# Patient Record
Sex: Female | Born: 1995 | Race: Black or African American | Hispanic: No | Marital: Single | Smoking: Never smoker
Health system: Southern US, Community
[De-identification: ages and names within clinical notes are randomized; demographics above are authoritative.]

---
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
## 2016-01-06 IMAGING — CR DG CHEST 2V
2 series · 2 of 2 positions shown · non-contrast
Comparison: None.

CLINICAL DATA: Acute onset of mid chest pain and cough. Dizziness.
Initial encounter.

EXAM:
CHEST  2 VIEW

[w chest pa]
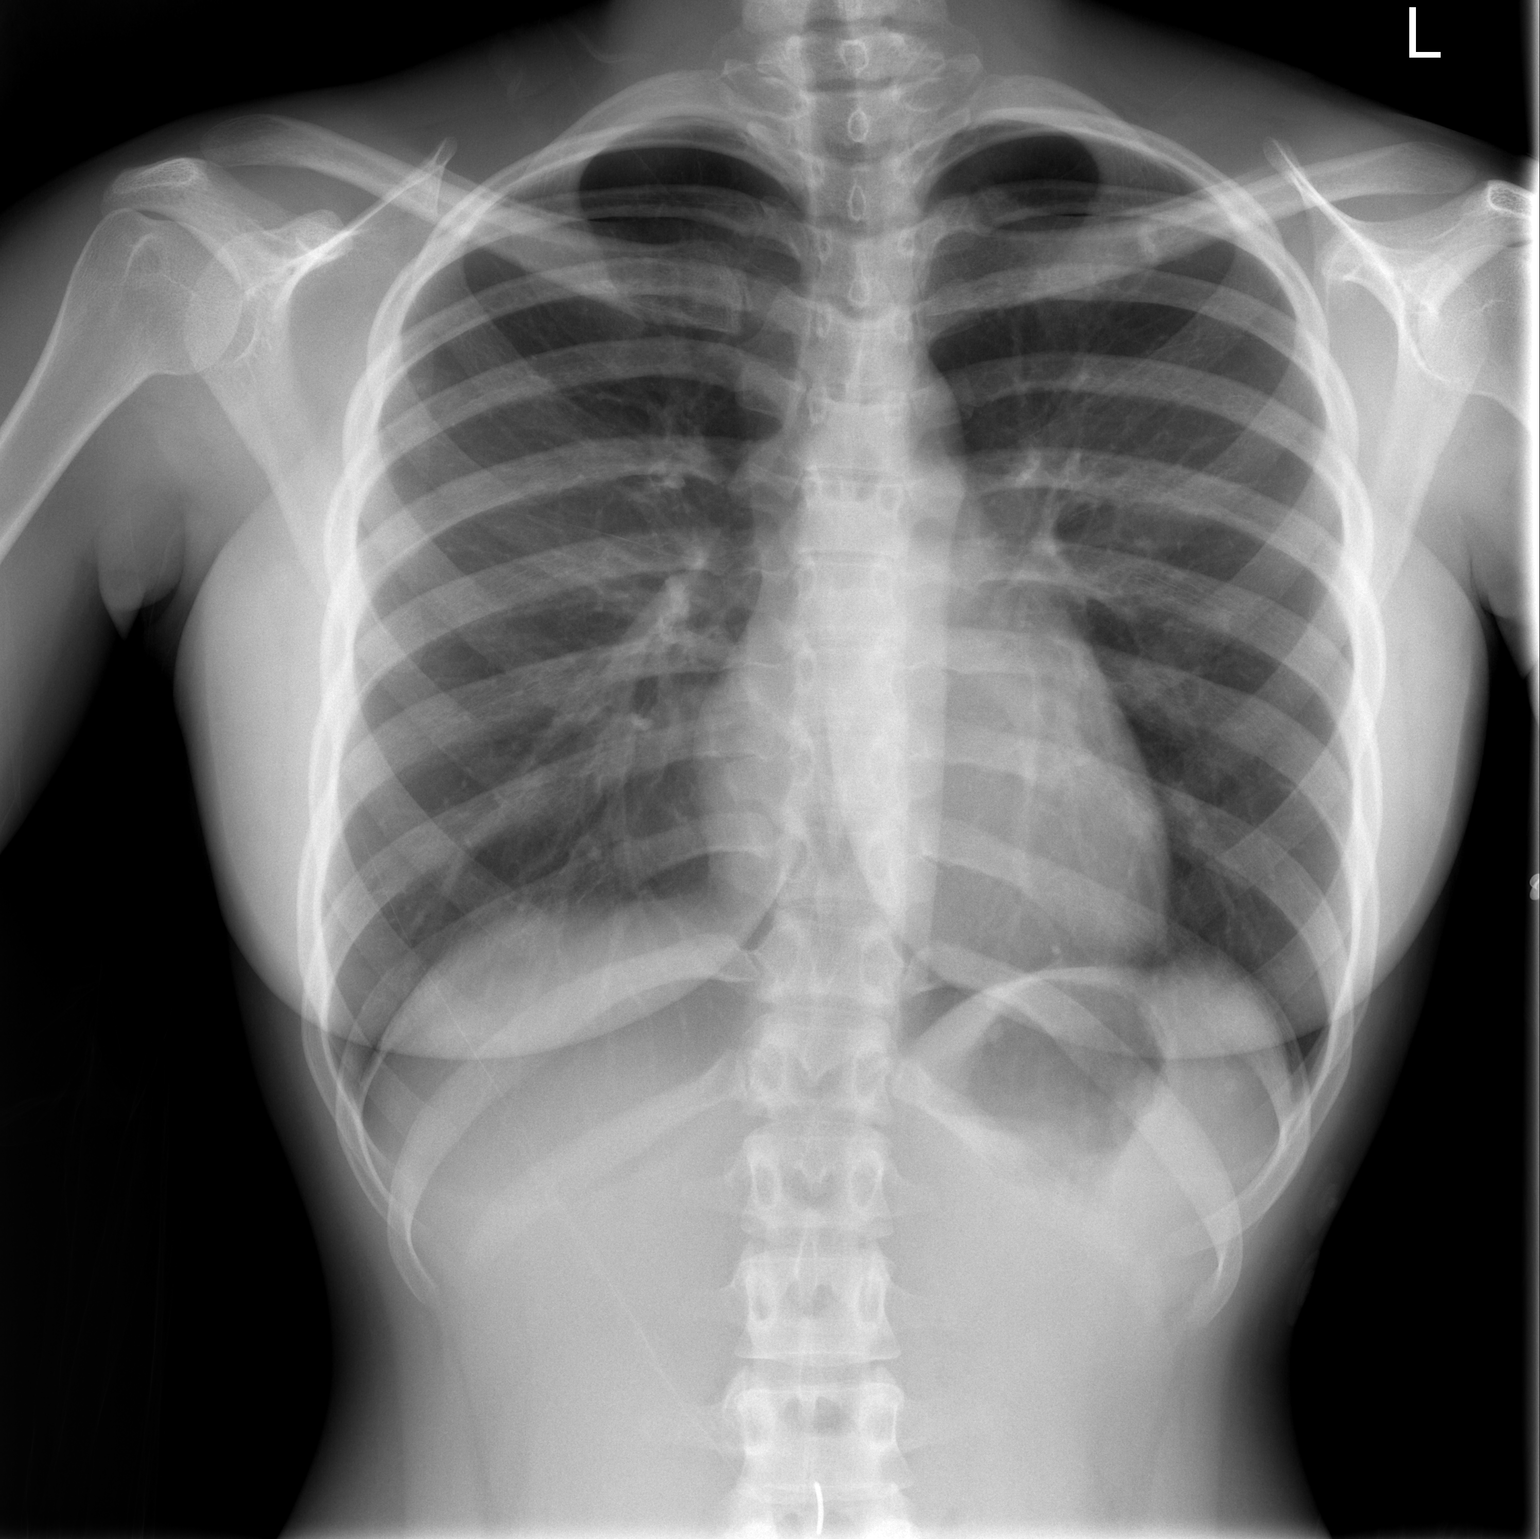

[w chest lat]
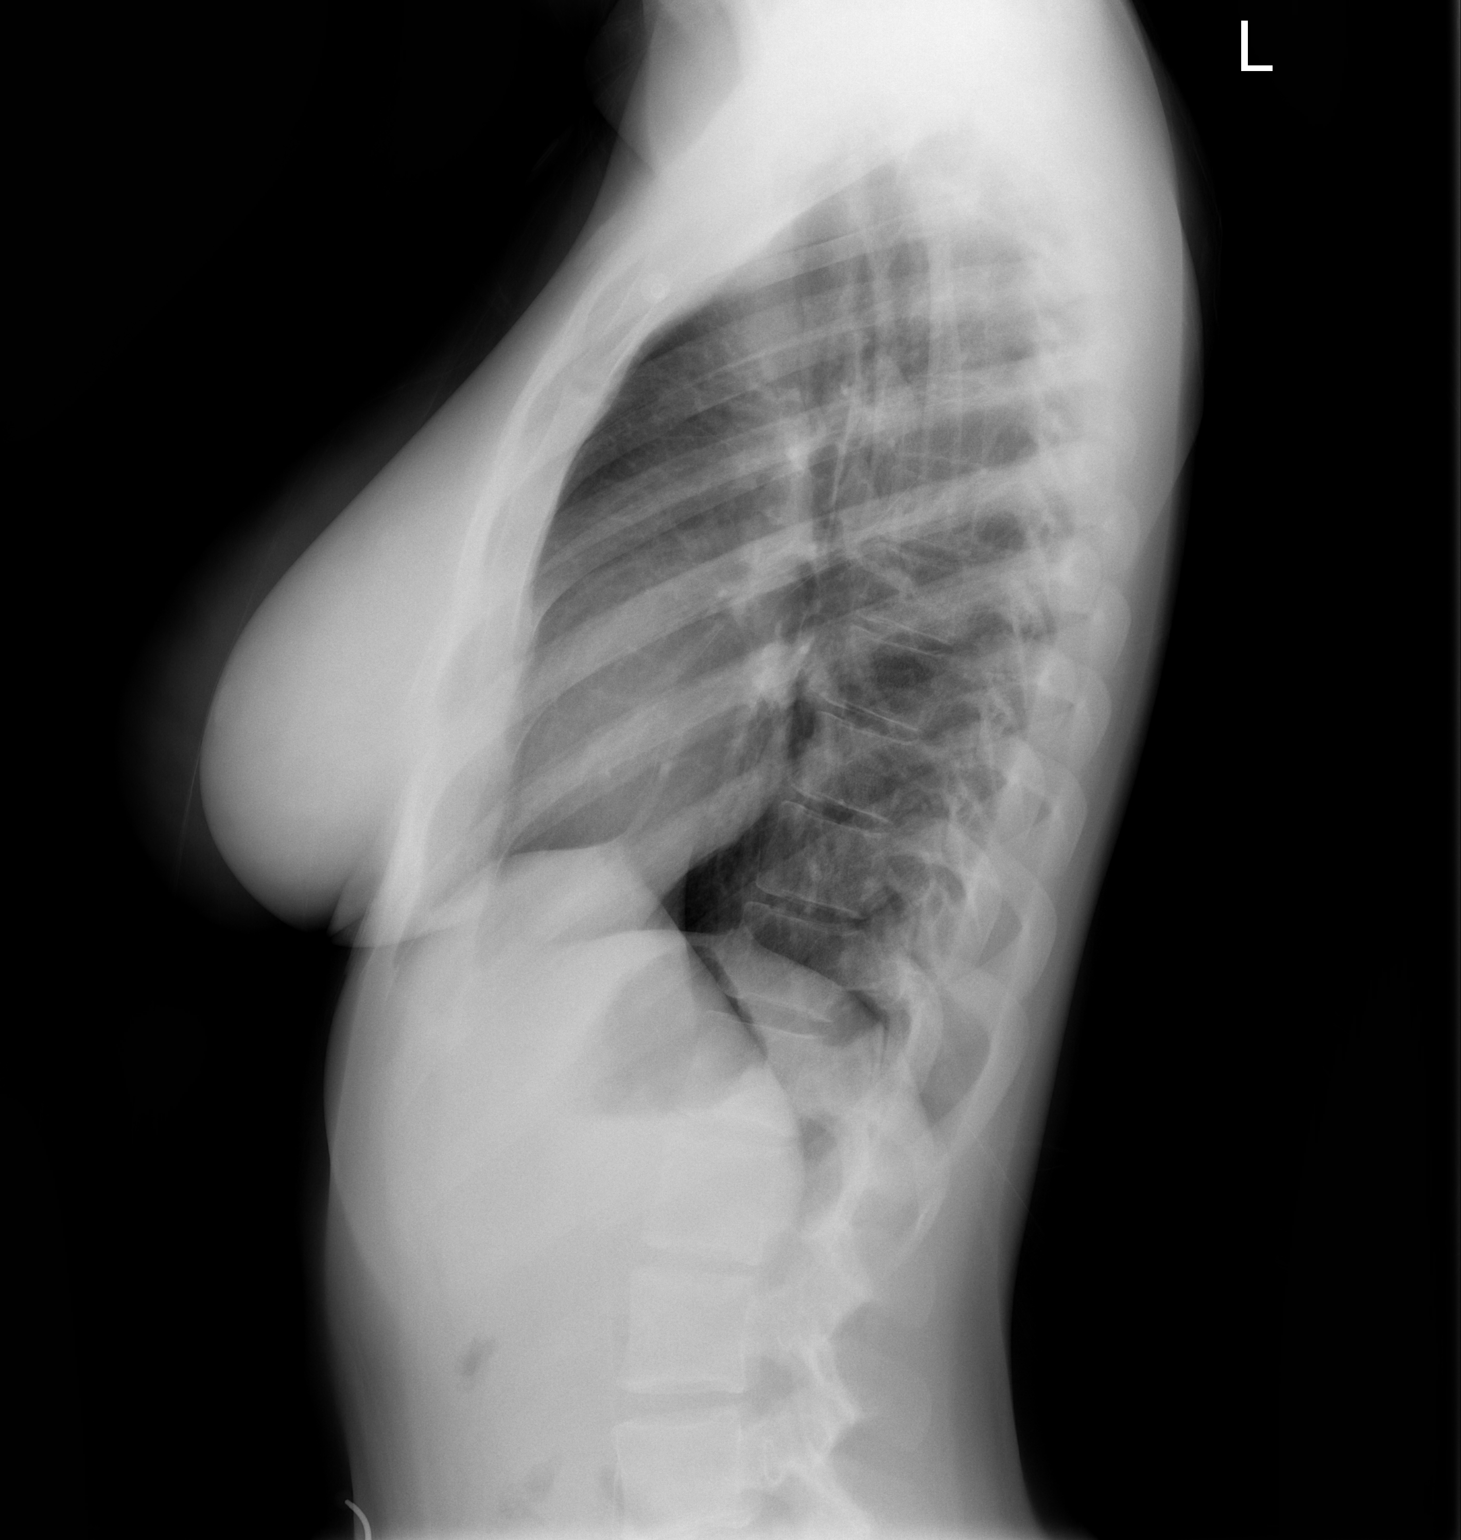

[2 of 2 positions shown; findings below may reference images not displayed]

FINDINGS: The lungs are well-aerated and clear. There is no evidence of focal
opacification, pleural effusion or pneumothorax.

The heart is normal in size; the mediastinal contour is within
normal limits. No acute osseous abnormalities are seen.
IMPRESSION: No acute cardiopulmonary process seen.

## 2016-12-23 ENCOUNTER — Emergency Department (HOSPITAL_BASED_OUTPATIENT_CLINIC_OR_DEPARTMENT_OTHER)
Admission: EM | Admit: 2016-12-23 | Discharge: 2016-12-23 | Disposition: A | Discharge status: Home / Self Care | Payer: Self-pay | Attending: Emergency Medicine | Admitting: Emergency Medicine

## 2016-12-23 ENCOUNTER — Encounter (HOSPITAL_BASED_OUTPATIENT_CLINIC_OR_DEPARTMENT_OTHER): Payer: Self-pay | Admitting: Emergency Medicine

## 2016-12-23 DIAGNOSIS — L03115 Cellulitis of right lower limb: Secondary | ICD-10-CM | POA: Insufficient documentation

## 2016-12-23 MED ORDER — SULFAMETHOXAZOLE-TRIMETHOPRIM 800-160 MG PO TABS: 1.0000 | ORAL_TABLET | Freq: Two times a day (BID) | ORAL | 0 refills | Status: AC

## 2016-12-23 MED ORDER — DIPHENHYDRAMINE HCL 25 MG PO CAPS
50.0000 mg | ORAL_CAPSULE | Freq: Once | ORAL | Status: AC
  Administered 2016-12-23: 50 mg via ORAL
  Filled 2016-12-23: qty 2

## 2016-12-23 MED ORDER — SULFAMETHOXAZOLE-TRIMETHOPRIM 800-160 MG PO TABS
1.0000 | ORAL_TABLET | Freq: Once | ORAL | Status: AC
  Administered 2016-12-23: 1 via ORAL
  Filled 2016-12-23: qty 1

## 2016-12-23 MED ORDER — DIPHENHYDRAMINE HCL 25 MG PO CAPS: 25.0000 mg | ORAL_CAPSULE | Freq: Three times a day (TID) | ORAL | 0 refills | Status: DC | PRN

## 2016-12-23 MED ORDER — CEPHALEXIN 500 MG PO CAPS: 500.0000 mg | ORAL_CAPSULE | Freq: Four times a day (QID) | ORAL | 0 refills | Status: DC

## 2016-12-23 MED ORDER — CEPHALEXIN 250 MG PO CAPS
500.0000 mg | ORAL_CAPSULE | Freq: Once | ORAL | Status: AC
  Administered 2016-12-23: 500 mg via ORAL
  Filled 2016-12-23: qty 2

## 2016-12-23 MED ORDER — PROBIOTIC PO CAPS: 1.0000 | ORAL_CAPSULE | Freq: Every day | ORAL | 1 refills | Status: DC

## 2016-12-23 NOTE — ED Triage Notes (Signed)
Pt has large round red area to posterior right thigh. Denies pain and itching

## 2016-12-23 NOTE — ED Provider Notes (Signed)
TIME SEEN: 3:36 AM  CHIEF COMPLAINT: Rash to the right leg  HPI: Patient is a 21 yo F with no significant past medical history who presents to the emergency department with a red, warm rash inside of her right thigh. She states it is itching. No fevers, chills, nausea, vomiting or diarrhea. No injury to this leg. Denies any recent insect bite or any new exposures. She states she noticed it yesterday. Does not feel like it is getting any bigger but is also not improving. She has not tried anything at home.  ROS: See HPI Constitutional: no fever  Eyes: no drainage  ENT: no runny nose   Cardiovascular:  no chest pain  Resp: no SOB  GI: no vomiting GU: no dysuria Integumentary: no rash  Allergy: no hives  Musculoskeletal: no leg swelling  Neurological: no slurred speech ROS otherwise negative  PAST MEDICAL HISTORY/PAST SURGICAL HISTORY:  History reviewed. No pertinent past medical history.  MEDICATIONS:  Prior to Admission medications   Medication Sig Start Date End Date Taking? Authorizing Provider  ibuprofen (ADVIL,MOTRIN) 600 MG tablet Take 1 tablet (600 mg total) by mouth every 6 (six) hours as needed for moderate pain. 09/04/15   Lyndal Pulley, MD    ALLERGIES:  No Known Allergies  SOCIAL HISTORY:  Social History  Substance Use Topics  . Smoking status: Never Smoker  . Smokeless tobacco: Never Used  . Alcohol use No    FAMILY HISTORY: No family history on file.  EXAM: BP (!) 154/108 (BP Location: Left Arm)   Pulse 83   Temp 98.3 F (36.8 C) (Oral)   Resp 16   Ht  (1.626 m)   Wt 68 kg (150 lb)   SpO2 100%   BMI 25.75 kg/m  CONSTITUTIONAL: Alert and oriented and responds appropriately to questions. Well-appearing; well-nourished HEAD: Normocephalic EYES: Conjunctivae clear, pupils appear equal, EOMI ENT: normal nose; moist mucous membranes NECK: Supple, no meningismus, no nuchal rigidity, no LAD  CARD: RRR; S1 and S2 appreciated; no murmurs, no clicks, no  rubs, no gallops RESP: Normal chest excursion without splinting or tachypnea; breath sounds clear and equal bilaterally; no wheezes, no rhonchi, no rales, no hypoxia or respiratory distress, speaking full sentences ABD/GI: Normal bowel sounds; non-distended; soft, non-tender, no rebound, no guarding, no peritoneal signs, no hepatosplenomegaly BACK:  The back appears normal and is non-tender to palpation, there is no CVA tenderness EXT: Normal ROM in all joints; non-tender to palpation; no edema; normal capillary refill; no cyanosis, no calf tenderness or swelling    SKIN: Normal color for age and race; warm; no rash, patient has approximately 10 x 6 cm area of redness, warmth with a small area of induration to the right inner and posterior thigh. There is no fluctuance on exam and no drainage. No urticaria. No care purpura. No blisters or desquamation. No bull's-eye rash. NEURO: Moves all extremities equally PSYCH: The patient's mood and manner are appropriate. Grooming and personal hygiene are appropriate.  MEDICAL DECISION MAKING: Patient here with what looks like cellulitis to the right inner thigh. It almost looks like she may have been by something as well. There is no drainage. There is no abscess for drainage. Given I'm concerned for possible infection I'm going to put her on Keflex and Bactrim for broad coverage but also recommended Benadryl as it is an antihistamine and could help if there is any allergic component and can help with her itching. This time I do not recommend steroids  given I'm concerned it could be infection. Have recommended Tylenol and Motrin for pain. We have outlined the area of redness with a tissue marker and have recommended if symptoms worsen and she develops systemic symptoms or this rash spread that she return to the emergency department or her PCP. Patient and family at bedside comfortable with this plan. No sign of any life-threatening illness, rash on exam.   At this  time, I do not feel there is any life-threatening condition present. I have reviewed and discussed all results (EKG, imaging, lab, urine as appropriate) and exam findings with patient/family. I have reviewed nursing notes and appropriate previous records.  I feel the patient is safe to be discharged home without further emergent workup and can continue workup as an outpatient as needed. Discussed usual and customary return precautions. Patient/family verbalize understanding and are comfortable with this plan.  Outpatient follow-up has been provided if needed. All questions have been answered.    Amir Fick, Layla Maw, DO 12/23/16 [phone]

## 2017-02-18 ENCOUNTER — Other Ambulatory Visit: Payer: Self-pay

## 2017-02-18 ENCOUNTER — Emergency Department (HOSPITAL_BASED_OUTPATIENT_CLINIC_OR_DEPARTMENT_OTHER)
Admission: EM | Admit: 2017-02-18 | Discharge: 2017-02-18 | Disposition: A | Discharge status: Home / Self Care | Payer: No Typology Code available for payment source | Attending: Emergency Medicine | Admitting: Emergency Medicine

## 2017-02-18 ENCOUNTER — Encounter (HOSPITAL_BASED_OUTPATIENT_CLINIC_OR_DEPARTMENT_OTHER): Payer: Self-pay | Admitting: Emergency Medicine

## 2017-02-18 DIAGNOSIS — Y998 Other external cause status: Secondary | ICD-10-CM | POA: Insufficient documentation

## 2017-02-18 DIAGNOSIS — S39012A Strain of muscle, fascia and tendon of lower back, initial encounter: Secondary | ICD-10-CM | POA: Diagnosis not present

## 2017-02-18 DIAGNOSIS — Y92410 Unspecified street and highway as the place of occurrence of the external cause: Secondary | ICD-10-CM | POA: Diagnosis not present

## 2017-02-18 DIAGNOSIS — Y9389 Activity, other specified: Secondary | ICD-10-CM | POA: Diagnosis not present

## 2017-02-18 DIAGNOSIS — S3992XA Unspecified injury of lower back, initial encounter: Secondary | ICD-10-CM | POA: Diagnosis present

## 2017-02-18 DIAGNOSIS — Z79899 Other long term (current) drug therapy: Secondary | ICD-10-CM | POA: Diagnosis not present

## 2017-02-18 MED ORDER — NAPROXEN 500 MG PO TABS: 500.0000 mg | ORAL_TABLET | Freq: Two times a day (BID) | ORAL | 0 refills | Status: DC
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
## 2018-06-06 ENCOUNTER — Other Ambulatory Visit: Payer: Self-pay

## 2018-06-06 ENCOUNTER — Encounter (HOSPITAL_BASED_OUTPATIENT_CLINIC_OR_DEPARTMENT_OTHER): Payer: Self-pay | Admitting: Emergency Medicine

## 2018-06-06 ENCOUNTER — Emergency Department (HOSPITAL_BASED_OUTPATIENT_CLINIC_OR_DEPARTMENT_OTHER)
Admission: EM | Admit: 2018-06-06 | Discharge: 2018-06-06 | Disposition: A | Discharge status: Home / Self Care | Payer: Self-pay | Attending: Emergency Medicine | Admitting: Emergency Medicine

## 2018-06-06 DIAGNOSIS — R110 Nausea: Secondary | ICD-10-CM | POA: Insufficient documentation

## 2018-06-06 LAB — URINALYSIS, ROUTINE W REFLEX MICROSCOPIC
Bilirubin Urine: NEGATIVE
Glucose, UA: NEGATIVE mg/dL
Hgb urine dipstick: NEGATIVE
Ketones, ur: NEGATIVE mg/dL
Nitrite: NEGATIVE
PROTEIN: NEGATIVE mg/dL
SPECIFIC GRAVITY, URINE: 1.025 (ref 1.005–1.030)
pH: 6 (ref 5.0–8.0)

## 2018-06-06 LAB — URINALYSIS, MICROSCOPIC (REFLEX)

## 2018-06-06 LAB — PREGNANCY, URINE: Preg Test, Ur: NEGATIVE

## 2018-06-06 MED ORDER — ONDANSETRON 4 MG PO TBDP: 4.0000 mg | ORAL_TABLET | Freq: Three times a day (TID) | ORAL | 0 refills | Status: DC | PRN

## 2018-06-06 NOTE — ED Provider Notes (Signed)
[facility] EMERGENCY DEPARTMENT Provider Note   CSN: [id number] Arrival date & time: 06/06/18  0827    History   Chief Complaint Chief Complaint  Patient presents with  . Nausea    HPI ZAKEYA BOUCH is a 23 y.o. female.     The history is provided by the patient. No language interpreter was used.   HAYDAN STYS is a 23 y.o. female who presents to the Emergency Department complaining of nausea. Two days ago at work she was exposed to a coworker that tested positive for the flu. Yesterday she began to feel slightly queasy and hot at times. She reports mild cough. No fevers, sore throat, nasal congestion, abdominal pain, vomiting. Currently her nausea has resolved. She denies any dysuria, vaginal discharge. She is not currently sexually active, LMP two weeks ago. She has no medical problems and takes no medications. She is requesting flu testing. No past medical history on file.  There are no active problems to display for this patient.   No past surgical history on file.   OB History   No obstetric history on file.      Home Medications    Prior to Admission medications   Medication Sig Start Date End Date Taking? Authorizing Provider  ondansetron (ZOFRAN ODT) 4 MG disintegrating tablet Take 1 tablet (4 mg total) by mouth every 8 (eight) hours as needed for nausea or vomiting. 06/06/18   Tilden Fossa, MD    Family History No family history on file.  Social History Social History   Tobacco Use  . Smoking status: Never Smoker  . Smokeless tobacco: Never Used  Substance Use Topics  . Alcohol use: No  . Drug use: No     Allergies   Patient has no known allergies.   Review of Systems Review of Systems  All other systems reviewed and are negative.    Physical Exam Updated Vital Signs BP 133/85   Pulse 89   Temp 98.2 F (36.8 C) (Oral)   Resp 16   Ht 5\' 4"  (1.626 m)   Wt 68 kg   LMP 05/23/2018   SpO2 100%   BMI 25.75 kg/m     Physical Exam Vitals signs and nursing note reviewed.  Constitutional:      Appearance: She is well-developed.  HENT:     Head: Normocephalic and atraumatic.     Mouth/Throat:     Mouth: Mucous membranes are moist.     Pharynx: No posterior oropharyngeal erythema.  Cardiovascular:     Rate and Rhythm: Normal rate and regular rhythm.     Heart sounds: No murmur.  Pulmonary:     Effort: Pulmonary effort is normal. No respiratory distress.     Breath sounds: Normal breath sounds.  Abdominal:     Palpations: Abdomen is soft.     Tenderness: There is no abdominal tenderness. There is no guarding or rebound.  Musculoskeletal:        General: No swelling or tenderness.  Skin:    General: Skin is warm and dry.     Capillary Refill: Capillary refill takes less than 2 seconds.  Neurological:     Mental Status: She is alert and oriented to person, place, and time.  Psychiatric:        Behavior: Behavior normal.      ED Treatments / Results  Labs (all labs ordered are listed, but only abnormal results are displayed) Labs Reviewed  URINALYSIS, ROUTINE W REFLEX MICROSCOPIC -  Abnormal; Notable for the following components:      Result Value   APPearance CLOUDY (*)    Leukocytes,Ua MODERATE (*)    All other components within normal limits  URINALYSIS, MICROSCOPIC (REFLEX) - Abnormal; Notable for the following components:   Bacteria, UA MANY (*)    All other components within normal limits  PREGNANCY, URINE    EKG None  Radiology No results found.  Procedures Procedures (including critical care time)  Medications Ordered in ED Medications - No data to display   Initial Impression / Assessment and Plan / ED Course  I have reviewed the triage vital signs and the nursing notes.  Pertinent labs & imaging results that were available during my care of the patient were reviewed by me and considered in my medical decision making (see chart for details).        Presents  to the emergency department complaining of nausea as well as flu exposure. She is non-toxic appearing on evaluation with no abdominal tenderness. With numerous squamous cells present, patient without dysuria, vaginal discharge. Would not treat for UTI in setting of current symptoms and urinalysis. Discussed with patient home care for nausea as well as flu exposure. Discussed outpatient follow-up and return precautions. Presentation is not consistent with acute appendicitis, sepsis.  Final Clinical Impressions(s) / ED Diagnoses   Final diagnoses:  Nausea    ED Discharge Orders         Ordered    ondansetron (ZOFRAN ODT) 4 MG disintegrating tablet  Every 8 hours PRN     06/06/18 0957           Tilden Fossa, MD 06/06/18 [phone]

## 2018-06-06 NOTE — ED Triage Notes (Addendum)
Pt states she has a co-worker that has the flu.  Yesterday and today pt has slight nausea.  Pt states she felt "hot".  No chills, no body aches, no runny nose, slight cough.  No acute illness noted.  Pt requesting to be tested for flu

## 2018-06-06 NOTE — ED Notes (Signed)
C/o nausea , slight ha and hot feeling after co worker test pos to flu 2 days ago

## 2019-10-23 ENCOUNTER — Emergency Department (HOSPITAL_BASED_OUTPATIENT_CLINIC_OR_DEPARTMENT_OTHER): Payer: Self-pay

## 2019-10-23 ENCOUNTER — Other Ambulatory Visit: Payer: Self-pay

## 2019-10-23 ENCOUNTER — Encounter (HOSPITAL_BASED_OUTPATIENT_CLINIC_OR_DEPARTMENT_OTHER): Payer: Self-pay | Admitting: Emergency Medicine

## 2019-10-23 DIAGNOSIS — Y999 Unspecified external cause status: Secondary | ICD-10-CM | POA: Insufficient documentation

## 2019-10-23 DIAGNOSIS — S90122A Contusion of left lesser toe(s) without damage to nail, initial encounter: Secondary | ICD-10-CM | POA: Insufficient documentation

## 2019-10-23 DIAGNOSIS — Y939 Activity, unspecified: Secondary | ICD-10-CM | POA: Insufficient documentation

## 2019-10-23 DIAGNOSIS — W2209XA Striking against other stationary object, initial encounter: Secondary | ICD-10-CM | POA: Insufficient documentation

## 2019-10-23 DIAGNOSIS — Y929 Unspecified place or not applicable: Secondary | ICD-10-CM | POA: Insufficient documentation

## 2019-10-23 MED ORDER — ACETAMINOPHEN 325 MG PO TABS
650.0000 mg | ORAL_TABLET | Freq: Once | ORAL | Status: AC
  Administered 2019-10-23: 650 mg via ORAL
  Filled 2019-10-23: qty 2

## 2019-10-23 NOTE — ED Triage Notes (Signed)
  Patient comes in with toe pain that started earlier today.  Patient states around 1430 she was playing with her niece and accidentally kicked a doorframe.  Left 4th toe is discolored and painful to touch.  Pain 7/10.

## 2019-10-24 ENCOUNTER — Emergency Department (HOSPITAL_BASED_OUTPATIENT_CLINIC_OR_DEPARTMENT_OTHER)
Admission: EM | Admit: 2019-10-24 | Discharge: 2019-10-24 | Disposition: A | Discharge status: Home / Self Care | Payer: Self-pay | Attending: Emergency Medicine | Admitting: Emergency Medicine

## 2019-10-24 DIAGNOSIS — S90122A Contusion of left lesser toe(s) without damage to nail, initial encounter: Secondary | ICD-10-CM

## 2019-10-24 MED ORDER — NAPROXEN 250 MG PO TABS
500.0000 mg | ORAL_TABLET | Freq: Once | ORAL | Status: AC
  Administered 2019-10-24: 500 mg via ORAL
  Filled 2019-10-24: qty 2

## 2019-10-24 NOTE — ED Provider Notes (Signed)
[facility] Provider Note: Tiffany Dell, MD, FACEP  CSN: [id number] MRN: [id number] ARRIVAL: 10/23/19 at 2316 ROOM: [facility]   CHIEF COMPLAINT  Toe Injury   HISTORY OF PRESENT ILLNESS  10/24/19 2:58 AM Tiffany Berry is a 24 y.o. female who injured her left fourth toe when she kicked a door frame accidentally.  This occurred about 2:30 PM yesterday afternoon.  Her left fourth toe is discolored and tender.  She rates the pain is a 7 out of 10, described as soreness, worse with weightbearing.    History reviewed. No pertinent past medical history.  History reviewed. No pertinent surgical history.  History reviewed. No pertinent family history.  Social History   Tobacco Use   Smoking status: Never Smoker   Smokeless tobacco: Never Used  Substance Use Topics   Alcohol use: No   Drug use: No    Prior to Admission medications   Not on File    Allergies Patient has no known allergies.   REVIEW OF SYSTEMS  Negative except as noted here or in the History of Present Illness.   PHYSICAL EXAMINATION  Initial Vital Signs Blood pressure (!) 135/93, pulse 91, temperature 98.8 F (37.1 C), resp. rate 19, height 5\' 4"  (1.626 m), weight 68 kg, last menstrual period 09/28/2019, SpO2 100 %.  Examination General: Well-developed, well-nourished female in no acute distress; appearance consistent with age of record HENT: normocephalic; atraumatic Eyes: Normal appearance Neck: supple Heart: regular rate and rhythm Lungs: clear to auscultation bilaterally Abdomen: soft; nondistended; nontender; bowel sounds present Extremities: No deformity; ecchymosis of left fourth toe without significant tenderness, distal capillary refill brisk Neurologic: Awake, alert and oriented; motor function intact in all extremities and symmetric; no facial droop Skin: Warm and dry Psychiatric: Normal mood and affect   RESULTS  Summary of this visit's results, reviewed and  interpreted by myself:   EKG Interpretation  Date/Time:    Ventricular Rate:    PR Interval:    QRS Duration:   QT Interval:    QTC Calculation:   R Axis:     Text Interpretation:        Laboratory Studies: No results found for this or any previous visit (from the past 24 hour(s)). Imaging Studies: DG Foot Complete Left  Result Date: 10/24/2019 CLINICAL DATA:  24 year old female with pain after blunt trauma, kicked a door frame. Left 4th toe injury. EXAM: LEFT FOOT - COMPLETE 3+ VIEW COMPARISON:  None. FINDINGS: Bone mineralization is within normal limits. The left 4th phalanges appear intact and normally aligned. There is no evidence of fracture or dislocation. There is no evidence of arthropathy or other focal bone abnormality. No discrete soft tissue injury. IMPRESSION: Negative. Electronically Signed   By: 30 M.D.   On: 10/24/2019 00:09    ED COURSE and MDM  Nursing notes, initial and subsequent vitals signs, including pulse oximetry, reviewed and interpreted by myself.  Vitals:   10/23/19 2328 10/23/19 2333  BP: (!) 135/93   Pulse: 91   Resp: 19   Temp:  98.8 F (37.1 C)  TempSrc: Oral   SpO2: 100%   Weight:  68 kg  Height:  5\' 4"  (1.626 m)   Medications  naproxen (NAPROSYN) tablet 500 mg (has no administration in time range)  acetaminophen (TYLENOL) tablet 650 mg (650 mg Oral Given 10/23/19 2340)   Examination and radiographs consistent with contusion of toe.  PROCEDURES  Procedures   ED DIAGNOSES  ICD-10-CM   1. Contusion of fourth toe of left foot, initial encounter  R41.638G        Paula Libra, MD 10/24/19 [phone]

## 2021-06-16 IMAGING — DX DG FOOT COMPLETE 3+V*L*
3 series · 3 of 3 positions shown · non-contrast
Comparison: None.

CLINICAL DATA: 24-year-old female with pain after blunt trauma,
kicked a door frame. Left 4th toe injury.

EXAM:
LEFT FOOT - COMPLETE 3+ VIEW

[foot ap]
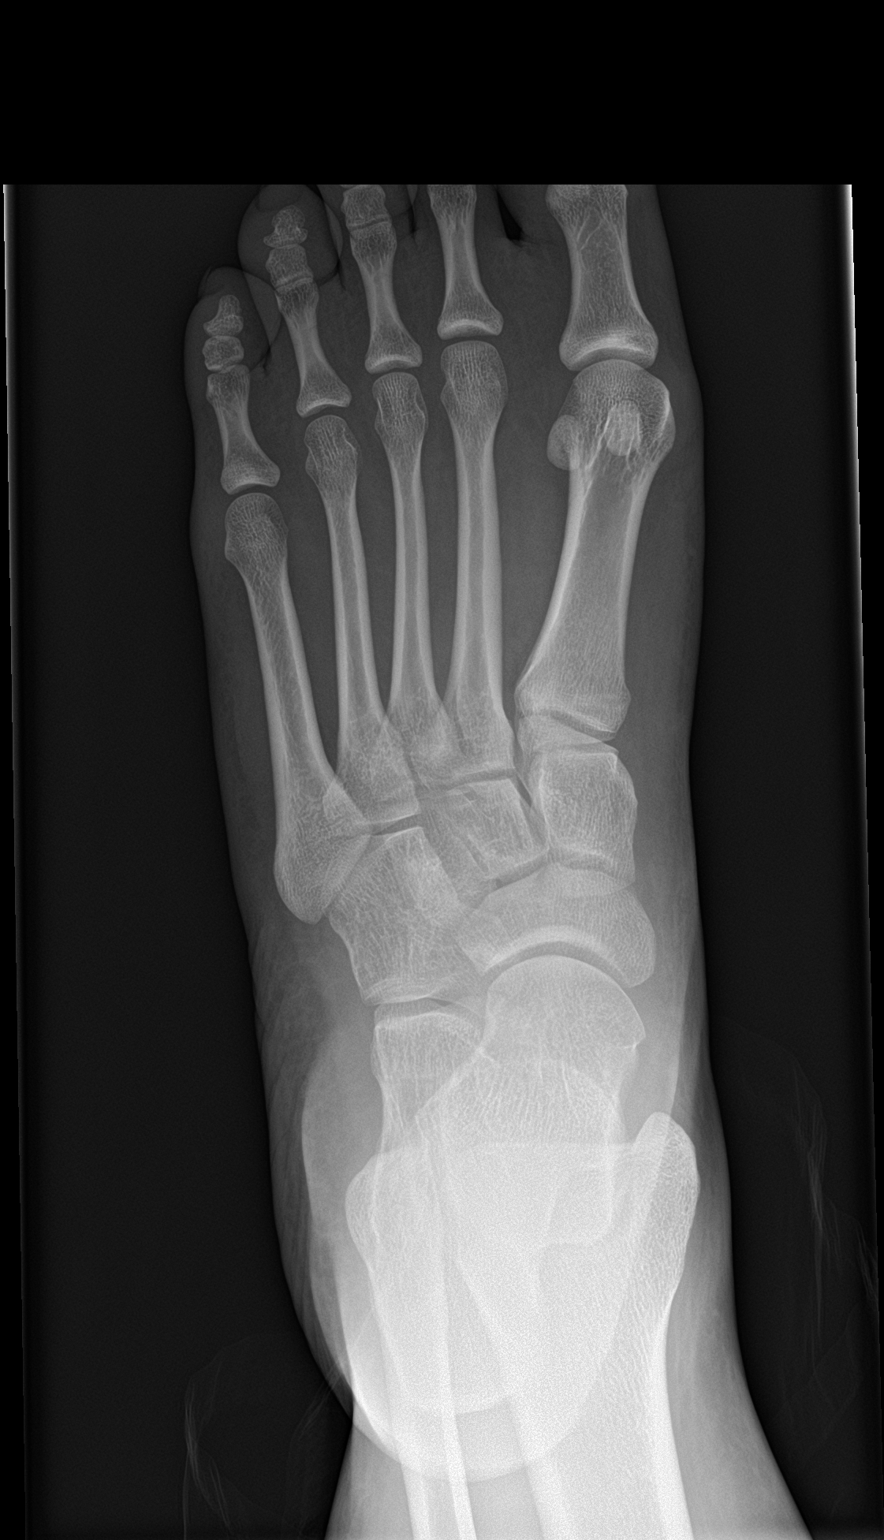

[foot obl]
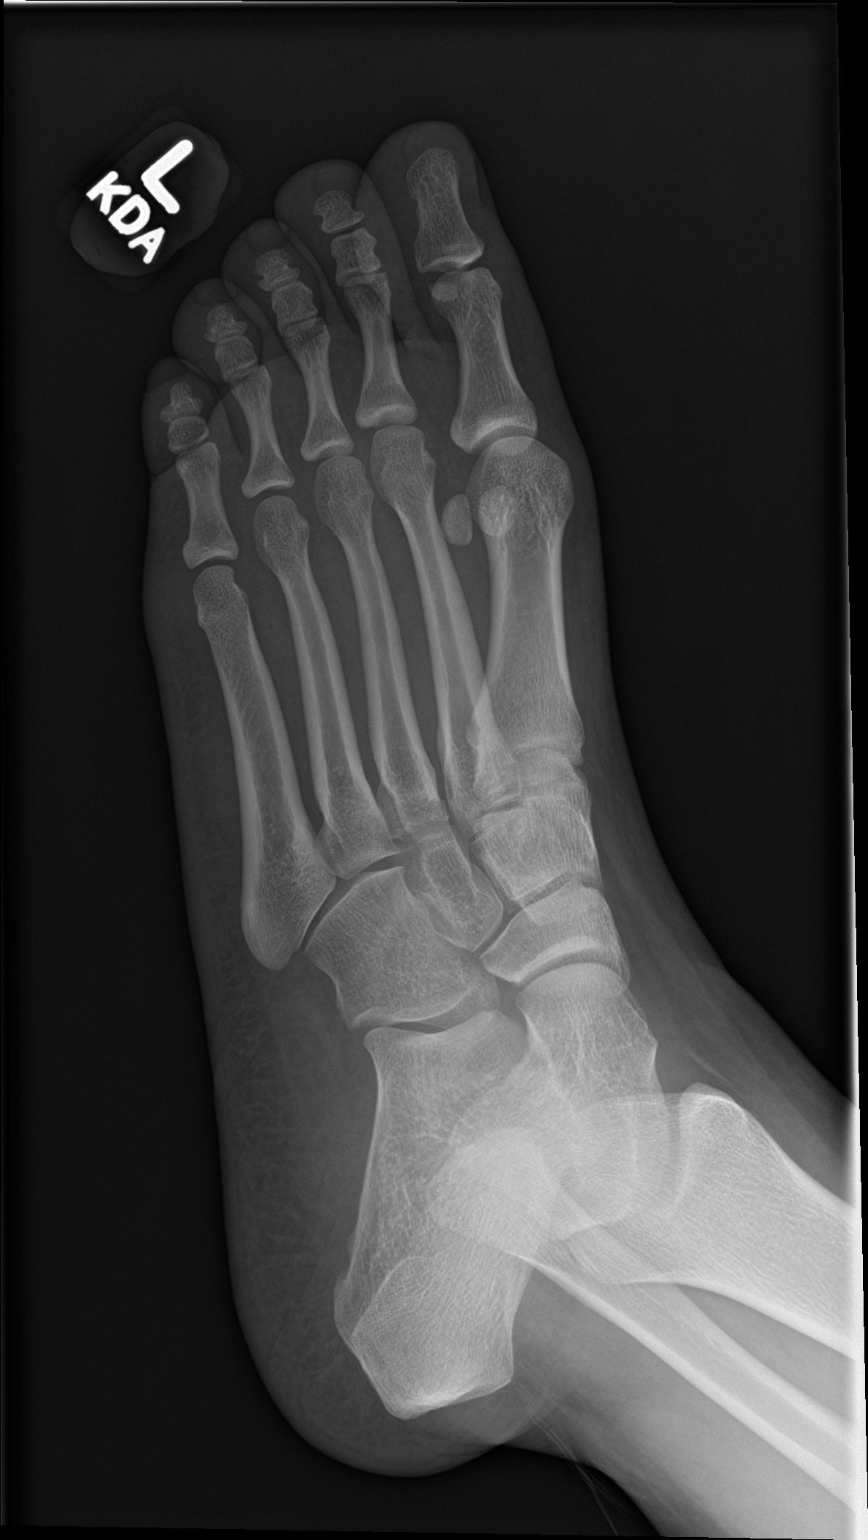

[foot lat]
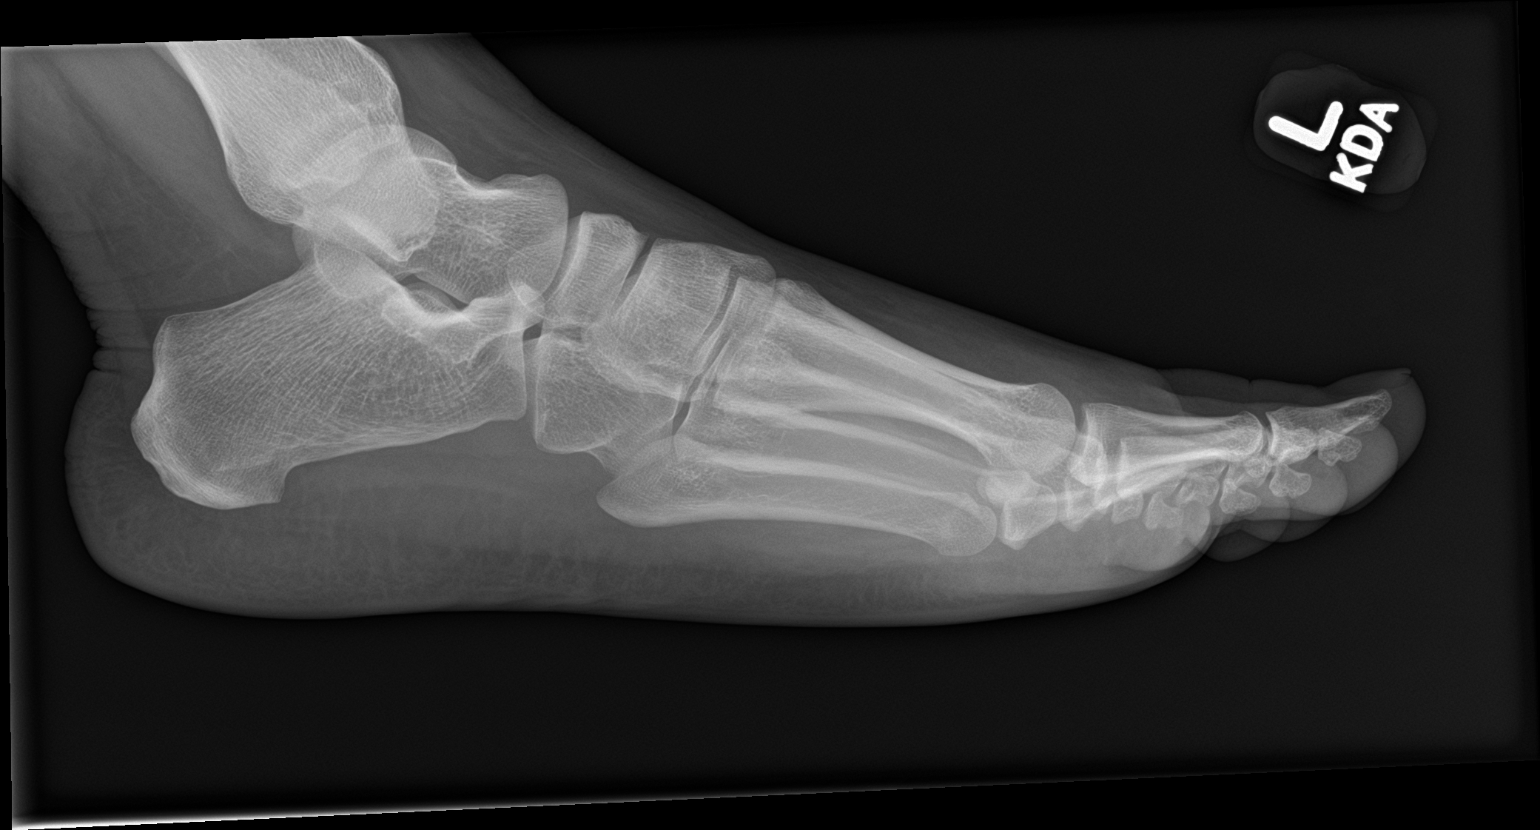

[3 of 3 positions shown; findings below may reference images not displayed]

FINDINGS: Bone mineralization is within normal limits. The left 4th phalanges
appear intact and normally aligned. There is no evidence of fracture
or dislocation. There is no evidence of arthropathy or other focal
bone abnormality. No discrete soft tissue injury.
IMPRESSION: Negative.
